# Patient Record
Sex: Male | Born: 1995 | Race: White | Hispanic: No | Marital: Single
Health system: Southern US, Community
[De-identification: ages and names within clinical notes are randomized; demographics above are authoritative.]

---
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
## 2014-06-07 LAB — CBC WITH DIFFERENTIAL/PLATELET
BASOS PCT: 0.5 %
Basophil #: 0.1 10*3/uL (ref 0.0–0.1)
Eosinophil #: 0.3 10*3/uL (ref 0.0–0.7)
Eosinophil %: 1.4 %
HCT: 50.6 % (ref 40.0–52.0)
HGB: 17.2 g/dL (ref 13.0–18.0)
LYMPHS PCT: 12.2 %
Lymphocyte #: 2.5 10*3/uL (ref 1.0–3.6)
MCH: 30.4 pg (ref 26.0–34.0)
MCHC: 34.1 g/dL (ref 32.0–36.0)
MCV: 89 fL (ref 80–100)
MONO ABS: 1.3 x10 3/mm — AB (ref 0.2–1.0)
Monocyte %: 6.2 %
NEUTROS PCT: 79.7 %
Neutrophil #: 16.4 10*3/uL — ABNORMAL HIGH (ref 1.4–6.5)
PLATELETS: 215 10*3/uL (ref 150–440)
RBC: 5.67 10*6/uL (ref 4.40–5.90)
RDW: 12.5 % (ref 11.5–14.5)
WBC: 20.5 10*3/uL — ABNORMAL HIGH (ref 3.8–10.6)

## 2014-06-07 LAB — URINALYSIS, COMPLETE
BACTERIA: NONE SEEN
BILIRUBIN, UR: NEGATIVE
Blood: NEGATIVE
GLUCOSE, UR: NEGATIVE mg/dL (ref 0–75)
KETONE: NEGATIVE
LEUKOCYTE ESTERASE: NEGATIVE
Nitrite: NEGATIVE
PROTEIN: NEGATIVE
Ph: 8 (ref 4.5–8.0)
RBC,UR: 1 /HPF (ref 0–5)
SPECIFIC GRAVITY: 1.015 (ref 1.003–1.030)
Squamous Epithelial: NONE SEEN
WBC UR: <1 /HPF (ref 0–5)

## 2014-06-07 LAB — COMPREHENSIVE METABOLIC PANEL
Albumin: 4.1 g/dL (ref 3.8–5.6)
Alkaline Phosphatase: 89 U/L
Anion Gap: 7 (ref 7–16)
BILIRUBIN TOTAL: 0.4 mg/dL (ref 0.2–1.0)
BUN: 10 mg/dL (ref 7–18)
CO2: 29 mmol/L (ref 21–32)
Calcium, Total: 9.5 mg/dL (ref 9.0–10.7)
Chloride: 105 mmol/L (ref 98–107)
Creatinine: 1.05 mg/dL (ref 0.60–1.30)
EGFR (African American): >60
EGFR (Non-African Amer.): >60
Glucose: 94 mg/dL (ref 65–99)
OSMOLALITY: 280 (ref 275–301)
Potassium: 3.6 mmol/L (ref 3.5–5.1)
SGOT(AST): 22 U/L (ref 10–41)
SGPT (ALT): 49 U/L
Sodium: 141 mmol/L (ref 136–145)
TOTAL PROTEIN: 7.8 g/dL (ref 6.4–8.6)

## 2014-06-07 LAB — TROPONIN I: Troponin-I: <0.02 ng/mL

## 2014-06-08 ENCOUNTER — Observation Stay: Payer: Self-pay | Admitting: Surgery

## 2014-06-10 LAB — PATHOLOGY REPORT

## 2015-01-09 NOTE — Discharge Summary (Signed)
PATIENT NAME:  Andrew Lawson, Andrew Lawson MR#:  [id number] DATE OF BIRTH:  [birth year]  DATE OF ADMISSION:  06/08/2014 DATE OF DISCHARGE:  06/08/2014  FINAL DIAGNOSIS: Acute suppurative appendicitis.   PRINCIPAL PROCEDURES: Laparoscopic appendectomy by Dr. Michela PitcherEly.   HOSPITAL COURSE SUMMARY:  The patient had an uneventful stay. He did well postoperatively with adequate pain control, was tolerating a regular diet, and was deemed suitable for discharge. Follow-up in the office in 7 to 10 days.   DISCHARGE MEDICATIONS: Were found on the reconciliation report to include Norco 5/325 one tab every 4 hours as needed for moderate pain.   Call with any questions or concerns.   ____________________________ Redge GainerMark A. Egbert GaribaldiBird, MD mab:lr Lawson: 06/14/2014 11:51:00 ET T: 06/14/2014 16:16:17 ET JOB#: [id number]  cc: Loraine LericheMark A. Egbert GaribaldiBird, MD, <Dictator> Raynald KempMARK A Latressa Harries MD ELECTRONICALLY SIGNED 06/15/2014 16:25

## 2015-01-09 NOTE — H&P (Signed)
PATIENT NAME:  Andrew Lawson, Andrew Lawson MR#:  [id number] DATE OF BIRTH:  03-04-1996  DATE OF ADMISSION:  06/07/2014  PRIMARY CARE PHYSICIAN:  None.   ADMITTING PHYSICIAN:  Quentin Orealph L. Ely III, MD  CHIEF COMPLAINT:  Abdominal pain.   BRIEF HISTORY:  Andrew Lawson is a 19 year old young man with vague abdominal pain that began on Saturday evening. The pain increased and then resolved spontaneously. It came back this morning (early in the morning) and he was intermittently in pain throughout the course of the day. The pain worsened over the afternoon. He presented to the Emergency Room for further evaluation. He has been anorexic but not nauseated and has not vomited. He last ate approximately 10 hours ago. He has had no previous similar symptoms. He denies any other history of GI problems and no previous surgery. He has no medical problems. He takes no medications regularly and has no medical allergies.   FAMILY HISTORY:  Positive for previous appendectomy in another brother.   REVIEW OF SYSTEMS:  Otherwise unremarkable.   PHYSICAL EXAMINATION:  GENERAL:  He is an alert, pleasant gentleman in no significant distress. He does not really look sick.  VITAL SIGNS:  Temperature is 98, heart rate is 92, blood pressure is 134/75.  HEENT:  Unremarkable with no scleral icterus. No pupillary abnormality. He does look a bit flushed.  NECK:  Supple with no adenopathy. He has a midline trachea. CHEST:  Clear with no adventitious sounds. He has normal pulmonary excursion. CARDIAC:  Reveals no murmurs or gallops. He seems to be in normal sinus rhythm.  ABDOMEN:  Soft with some moderate midepigastric, periumbilical, suprapubic tenderness. He has some mild right lower quadrant guarding. No significant rebound. He has active bowel sounds.  LOWER EXTREMITIES:  Reveals full range of motion. No deformities.  PSYCHIATRIC:  Normal orientation, normal affect.   IMPRESSION AND PLAN:  His white blood cell count on admission is  20,000. CT scan demonstrates a large dilated appendix with appendicolith and periappendiceal fluid consistent with acute appendicitis. Although his clinical presentation and history are atypical for appendicitis with elevated white blood cell count and CT findings, we will recommend surgical intervention. They are in agreement.    ____________________________ Carmie Endalph L. Ely III, MD rle:ts Lawson: 06/07/2014 23:46:06 ET T: 06/08/2014 00:12:03 ET JOB#: [id number]  cc: Quentin Orealph L. Ely III, MD, <Dictator> Quentin OreALPH L ELY MD ELECTRONICALLY SIGNED 06/19/2014 17:50

## 2015-01-09 NOTE — Op Note (Signed)
PATIENT NAME:  Andrew Lawson, [name] MR#:  [id number] DATE OF BIRTH:  Mar 01, 1996  DATE OF PROCEDURE:  06/08/2014  PREOPERATIVE DIAGNOSIS: Acute appendicitis.   POSTOPERATIVE DIAGNOSIS: Acute appendicitis.  OPERATION: Laparoscopic appendectomy.  ANESTHESIA: General.  SURGEON: Quentin Orealph L. Ely III, MD  OPERATIVE PROCEDURE: With the patient in the supine position, after the induction of appropriate general anesthesia, the patient's abdomen was prepped with ChloraPrep and draped with sterile towels. The patient was placed in a head down, feet up position. A small infraumbilical incision was made in a standard fashion and carried down bluntly to the subcutaneous tissue. A Veress needle was used to cannulate the peritoneal cavity. CO2 was insufflated to appropriate pressure measurements. Then approximately 2.5 L of CO2 was instilled. The Veress needle was withdrawn. An 11 mm [employer] port inserted into the peritoneal cavity. Intraperitoneal position was confirmed and CO2 was reinsufflated. Right lower quadrant was inspected. The appendix was very large, distended, obviously inflamed. The left lower quadrant 11 mm port was inserted under direct vision and a suprapubic 12 mm port was inserted under direct vision. The mesoappendix was divided with 3 applications of the Endo GIA stapling device carrying a white load. The base of the appendix was divided with a single application of the Endo GIA stapler carrying a blue load. The appendix was retrieved in an EndoCatch apparatus and removed without difficulty. The area was copiously suction irrigated with 2 L of warm saline solution. Suprapubic incision was closed with figure-of-eight suture of 0 Vicryl under direct vision using the suture passer. Abdomen was desufflated. All ports were withdrawn without difficulty. Skin incisions were closed with 5-0 nylon. The area was infiltrated with 0.25% Marcaine for postoperative pain control. Sterile dressings were applied. The  patient was returned to the recovery room having tolerated the procedure well. Sponge, instrument, and needle count were correct x 2 in the operating room.   ____________________________ Quentin Orealph L. Ely III, MD rle:ST D: 06/08/2014 01:13:00 ET T: 06/08/2014 02:00:11 ET JOB#: [id number]  cc: Quentin Orealph L. Ely III, MD, <Dictator> Quentin OreALPH L ELY MD ELECTRONICALLY SIGNED 06/19/2014 17:50

## 2016-01-08 IMAGING — CT CT ABD-PELV W/ CM
2 of 4 series · 16 of 46 positions shown, 18 images · IV contrast (agent unspecified)
Comparison: None.

CLINICAL DATA: Intermittent abdominal pain since yesterday and now
worse. Leukocytosis.

EXAM:
CT ABDOMEN AND PELVIS WITH CONTRAST
TECHNIQUE: Multidetector CT imaging of the abdomen and pelvis was performed
using the standard protocol following bolus administration of
intravenous contrast.
CONTRAST:  100 cc Hsovue-ODD

[Series 2: routine abd pel with · axial · 0.75mm/px · z∈[-796,-330]mm · 13 of 103 slices shown, 15 images]
[im 5/103  soft-tissue]
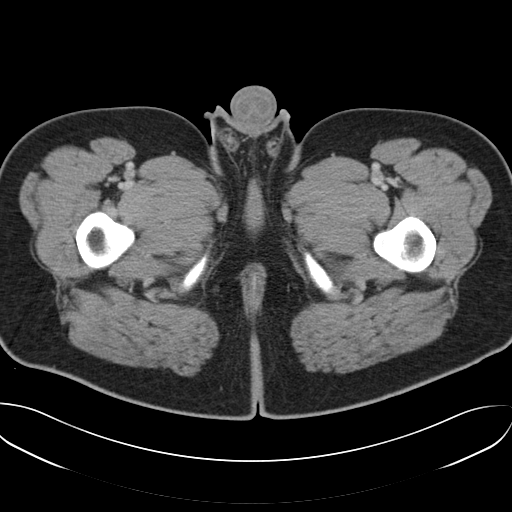
[im 5/103  bone]
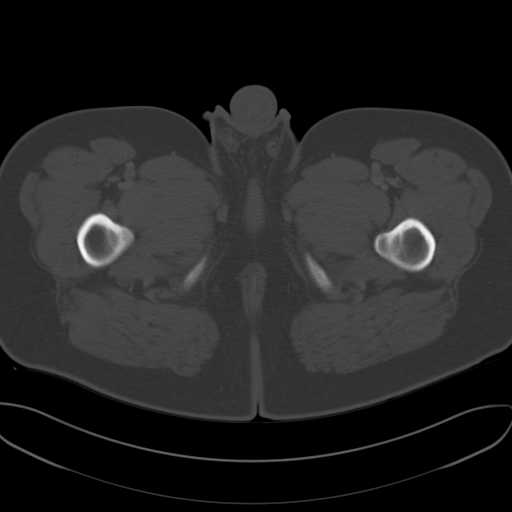
[im 13/103  soft-tissue]
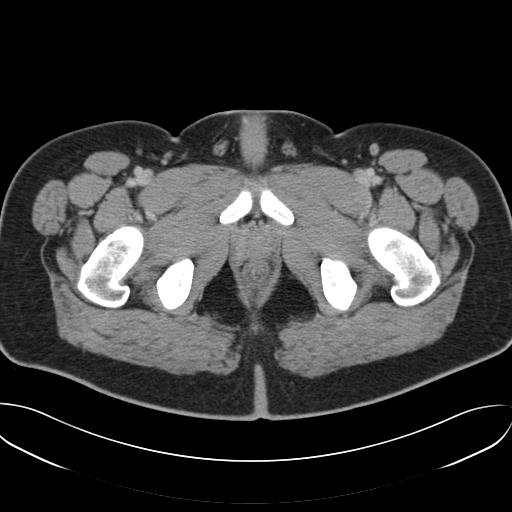
[im 22/103  soft-tissue]
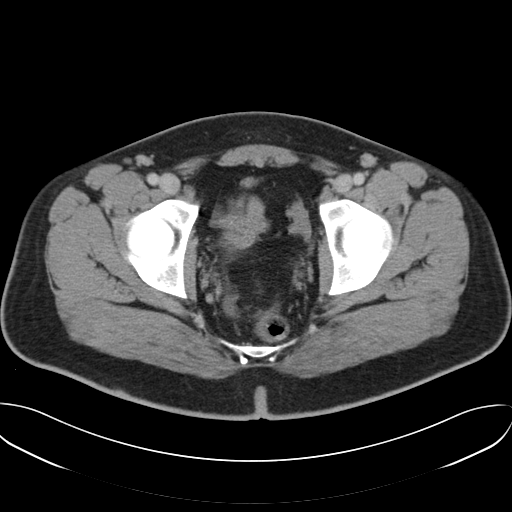
[im 30/103  soft-tissue]
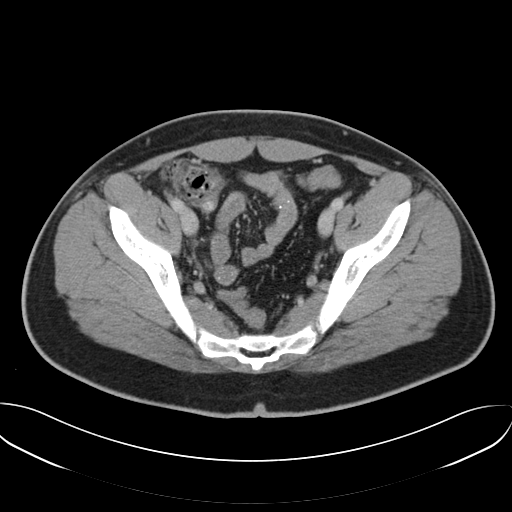
[im 35/103  soft-tissue]
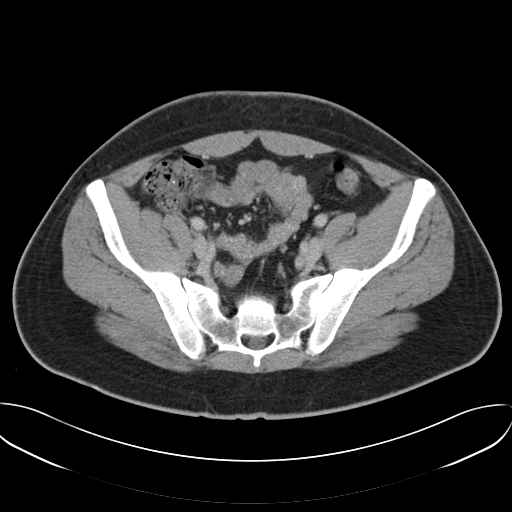
[im 43/103  soft-tissue]
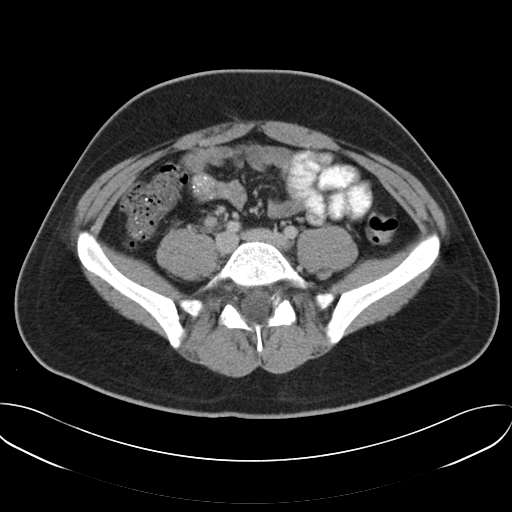
[im 52/103  soft-tissue]
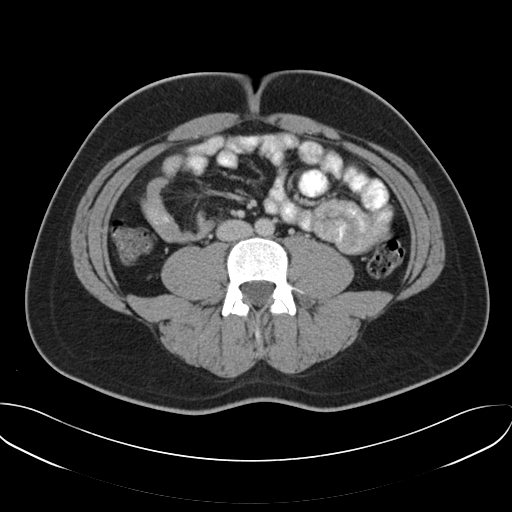
[im 60/103  soft-tissue]
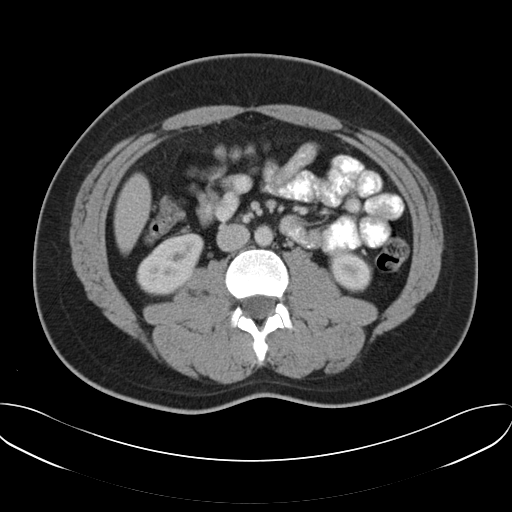
[im 69/103  soft-tissue]
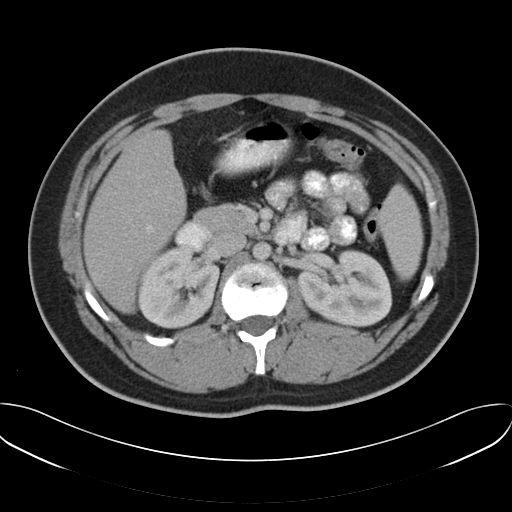
[im 69/103  bone]
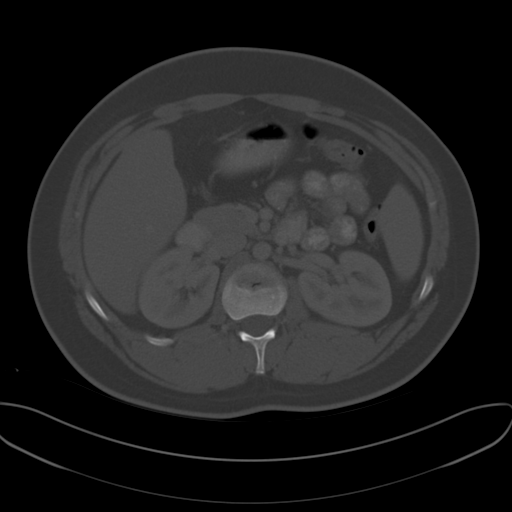
[im 73/103  soft-tissue]
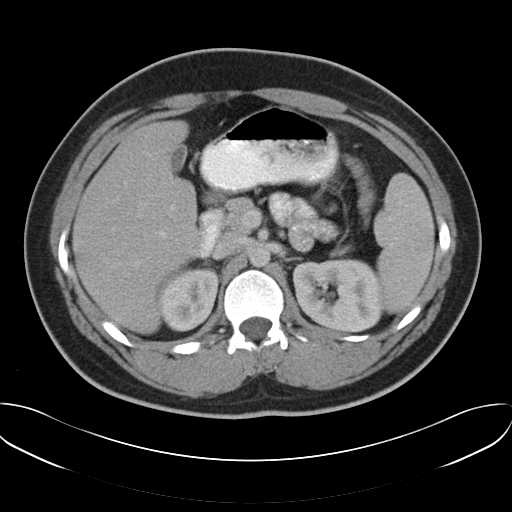
[im 81/103  soft-tissue]
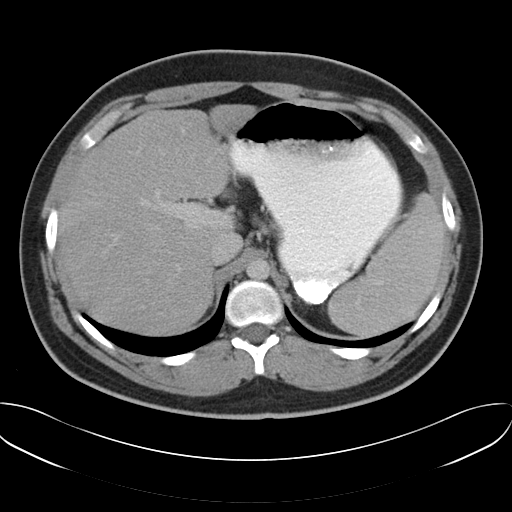
[im 90/103  soft-tissue]
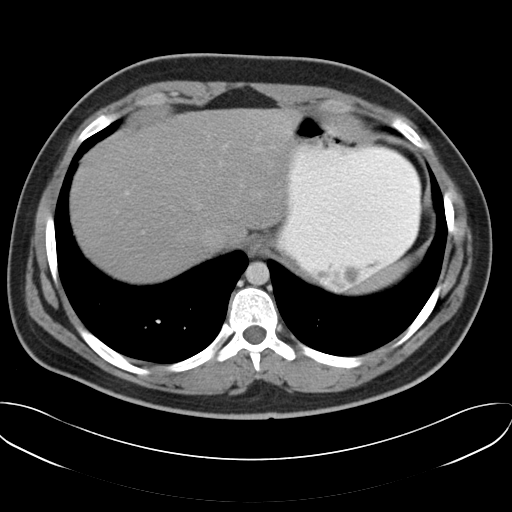
[im 98/103  soft-tissue]
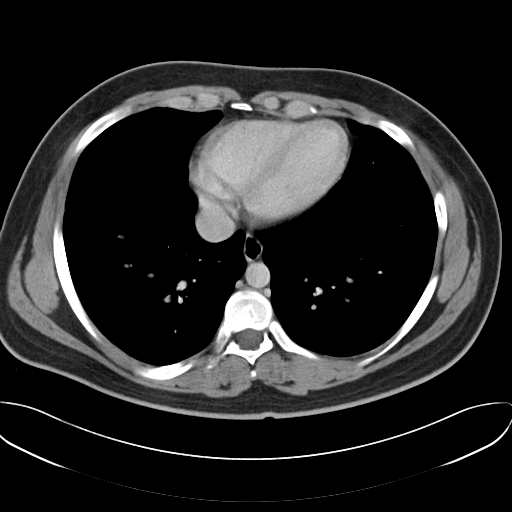

[Series 5: cor routine abd pel with · coronal · 0.72mm/px · 3 of 127 slices shown]
[im 43/127  soft-tissue]
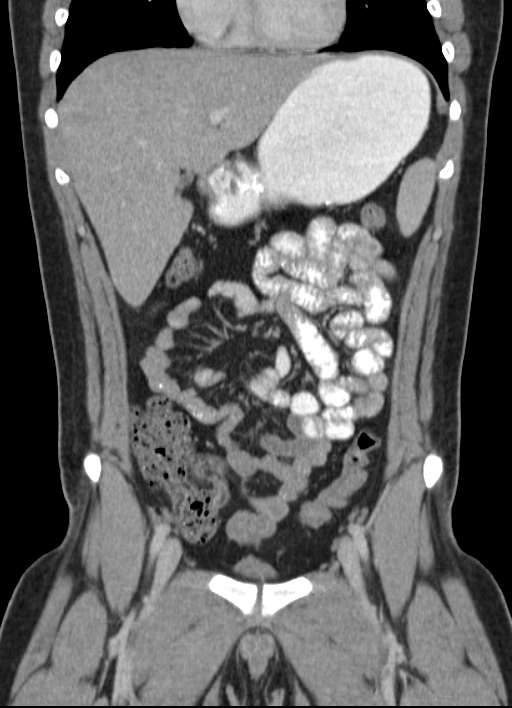
[im 57/127  soft-tissue]
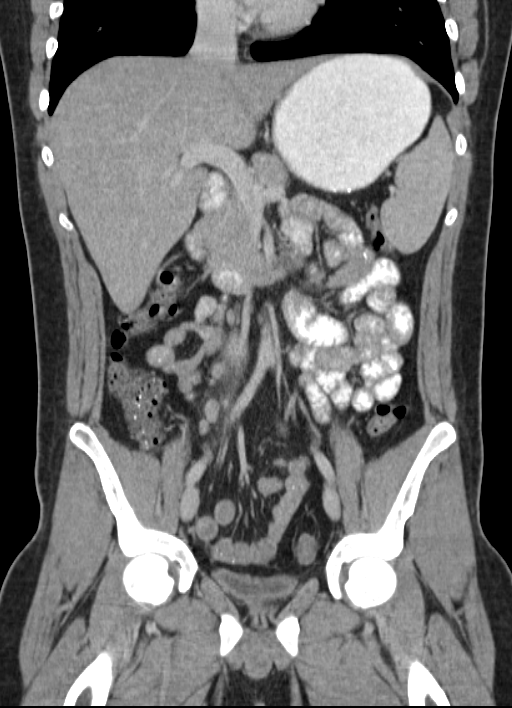
[im 71/127  soft-tissue]
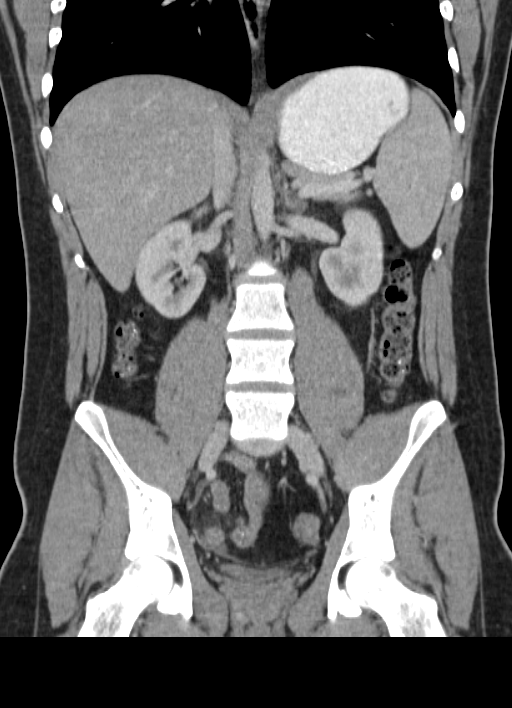

[16 of 46 positions shown; findings below may reference images not displayed]

FINDINGS: LUNG BASES: Included view of the lung bases are clear. Visualized
heart and pericardium are unremarkable.

SOLID ORGANS: The liver, spleen, gallbladder, pancreas and adrenal
glands are unremarkable.

GASTROINTESTINAL TRACT: The stomach, small and large bowel are
normal in course and caliber without inflammatory changes. The
appendix is enlarged, 17 mm with mild periappendiceal inflammation.
7 mm appendicolith at the appendix origin.

KIDNEYS/ URINARY TRACT: Kidneys are orthotopic, demonstrating
symmetric enhancement. No nephrolithiasis, hydronephrosis or solid
renal masses. The unopacified ureters are normal in course and
caliber. Urinary bladder is decompressed and unremarkable.

PERITONEUM/RETROPERITONEUM: Trace free fluid in RIGHT pelvis. No
intraperitoneal free air. Aortoiliac vessels are normal in course
and caliber. No lymphadenopathy by CT size criteria. Internal
reproductive organs are unremarkable.

SOFT TISSUE/OSSEOUS STRUCTURES: Grade 1/2 L5-S1 anterolisthesis with
bilateral L5 pars interarticularis defects.
IMPRESSION: Acute uncomplicated appendicitis. Trace free fluid in the RIGHT
pelvis is likely reactive.

Acute findings discussed with and reconfirmed by Dr.TIGER

  By: Betzabe Viveros
